# Patient Record
Sex: Female | Born: 1945 | Race: Black or African American | Hispanic: No | Marital: Married | Smoking: Never smoker
Health system: Southern US, Community
[De-identification: ages and names within clinical notes are randomized; demographics above are authoritative.]

## PROBLEM LIST (undated history)

## (undated) DIAGNOSIS — N6019 Diffuse cystic mastopathy of unspecified breast: Secondary | ICD-10-CM

## (undated) DIAGNOSIS — N63 Unspecified lump in unspecified breast: Secondary | ICD-10-CM

## (undated) DIAGNOSIS — I10 Essential (primary) hypertension: Secondary | ICD-10-CM

## (undated) DIAGNOSIS — Z1211 Encounter for screening for malignant neoplasm of colon: Secondary | ICD-10-CM

## (undated) DIAGNOSIS — M8580 Other specified disorders of bone density and structure, unspecified site: Secondary | ICD-10-CM

## (undated) HISTORY — DX: Diffuse cystic mastopathy of unspecified breast: N60.19

## (undated) HISTORY — DX: Essential (primary) hypertension: I10

## (undated) HISTORY — DX: Unspecified lump in unspecified breast: N63.0

## (undated) HISTORY — DX: Other specified disorders of bone density and structure, unspecified site: M85.80

## (undated) HISTORY — DX: Encounter for screening for malignant neoplasm of colon: Z12.11

## (undated) HISTORY — PX: TUBAL LIGATION: SHX77

---

## 1976-01-29 HISTORY — PX: LAPAROSCOPIC SALPINGOOPHERECTOMY: SUR795

## 1976-01-29 HISTORY — PX: ABDOMINAL HYSTERECTOMY: SHX81

## 1989-01-28 HISTORY — PX: BREAST BIOPSY: SHX20

## 2003-01-29 HISTORY — PX: COLONOSCOPY: SHX174

## 2004-09-27 ENCOUNTER — Ambulatory Visit: Payer: Self-pay | Admitting: General Surgery

## 2005-07-30 ENCOUNTER — Ambulatory Visit: Payer: Self-pay | Admitting: General Surgery

## 2005-10-04 ENCOUNTER — Ambulatory Visit: Payer: Self-pay | Admitting: General Surgery

## 2006-10-09 ENCOUNTER — Ambulatory Visit: Payer: Self-pay | Admitting: General Surgery

## 2007-10-13 ENCOUNTER — Ambulatory Visit: Payer: Self-pay | Admitting: General Surgery

## 2007-10-16 ENCOUNTER — Ambulatory Visit: Payer: Self-pay | Admitting: General Surgery

## 2007-10-31 ENCOUNTER — Emergency Department: Payer: Self-pay | Admitting: Internal Medicine

## 2007-12-09 ENCOUNTER — Ambulatory Visit: Payer: Self-pay | Admitting: Urology

## 2008-10-20 ENCOUNTER — Ambulatory Visit: Payer: Self-pay | Admitting: General Surgery

## 2009-10-23 ENCOUNTER — Ambulatory Visit: Payer: Self-pay | Admitting: General Surgery

## 2010-10-03 ENCOUNTER — Ambulatory Visit: Payer: Self-pay | Admitting: Family Medicine

## 2010-11-08 ENCOUNTER — Ambulatory Visit: Payer: Self-pay | Admitting: General Surgery

## 2011-11-14 ENCOUNTER — Ambulatory Visit: Payer: Self-pay | Admitting: General Surgery

## 2012-07-01 ENCOUNTER — Encounter: Payer: Self-pay | Admitting: *Deleted

## 2012-11-19 ENCOUNTER — Ambulatory Visit: Payer: Self-pay | Admitting: General Surgery

## 2012-11-20 ENCOUNTER — Encounter: Payer: Self-pay | Admitting: General Surgery

## 2012-11-25 ENCOUNTER — Ambulatory Visit: Payer: Self-pay | Admitting: General Surgery

## 2012-12-03 ENCOUNTER — Ambulatory Visit (INDEPENDENT_AMBULATORY_CARE_PROVIDER_SITE_OTHER): Payer: Medicare PPO | Admitting: General Surgery

## 2012-12-03 ENCOUNTER — Encounter: Payer: Self-pay | Admitting: General Surgery

## 2012-12-03 VITALS — BP 130/68 | HR 74 | Resp 16 | Ht 66.0 in | Wt 143.0 lb

## 2012-12-03 DIAGNOSIS — Z1239 Encounter for other screening for malignant neoplasm of breast: Secondary | ICD-10-CM

## 2012-12-03 DIAGNOSIS — N6019 Diffuse cystic mastopathy of unspecified breast: Secondary | ICD-10-CM | POA: Insufficient documentation

## 2012-12-03 NOTE — Progress Notes (Signed)
Patient ID: Melody Randall, female   DOB: [birth year], 67 y.o.   MRN: [id number]  Chief Complaint  Patient presents with  . Follow-up    mammogram    HPI Melody Randall is a 67 y.o. female who presents for a breast evaluation. The most recent mammogram was done on 11/19/12. Patient does perform regular self breast checks and gets regular mammograms done. The patient denies any problems with the breasts at this time.    HPI  Past Medical History  Diagnosis Date  . Hypertension   . Diffuse cystic mastopathy   . Osteopenia   . Lump or mass in breast     Past Surgical History  Procedure Laterality Date  . Abdominal hysterectomy  1978  . Laparoscopic salpingoopherectomy  1978  . Tubal ligation    . Colonoscopy  2005    Rihaan Barrack  . Breast biopsy Left 1991    History reviewed. No pertinent family history.  Social History History  Substance Use Topics  . Smoking status: Never Smoker   . Smokeless tobacco: Never Used  . Alcohol Use: No    No Known Allergies  Current Outpatient Prescriptions  Medication Sig Dispense Refill  . triamterene-hydrochlorothiazide (MAXZIDE-25) 37.5-25 MG per tablet Take 1 tablet by mouth daily.       No current facility-administered medications for this visit.    Review of Systems Review of Systems  Constitutional: Negative.   Respiratory: Negative.   Cardiovascular: Negative.     Blood pressure 130/68, pulse 74, resp. rate 16, height 5\' 6"  (1.676 m), weight 143 lb (64.864 kg).  Physical Exam Physical Exam  Constitutional: She is oriented to person, place, and time. She appears well-developed and well-nourished.  Eyes: Conjunctivae are normal. No scleral icterus.  Neck: No thyromegaly present.  Cardiovascular: Normal rate, regular rhythm and normal heart sounds.   No murmur heard. Pulmonary/Chest: Effort normal and breath sounds normal. Right breast exhibits no inverted nipple, no mass, no nipple discharge, no skin change and no tenderness.  Left breast exhibits no inverted nipple, no mass, no nipple discharge, no skin change and no tenderness.  Lymphadenopathy:    She has no cervical adenopathy.    She has no axillary adenopathy.  Neurological: She is alert and oriented to person, place, and time.  Skin: Skin is warm and dry.    Data Reviewed  Mammogram reviewed.   Assessment    Exam stable.      Plan    Patient to return in 1 year with bilateral screening mammogram.        Gerlene Burdock G 12/03/2012, 10:49 AM

## 2012-12-03 NOTE — Patient Instructions (Signed)
Patient to return in 1 year with bilateral screening mammogram. Patient to continue self breast checks. She is advised to contact our office with any new questions or concerns.

## 2013-11-29 ENCOUNTER — Encounter: Payer: Self-pay | Admitting: General Surgery

## 2013-12-16 ENCOUNTER — Ambulatory Visit: Payer: Medicare PPO | Admitting: General Surgery

## 2014-01-06 ENCOUNTER — Ambulatory Visit: Payer: Self-pay | Admitting: Nurse Practitioner

## 2014-01-13 ENCOUNTER — Ambulatory Visit: Payer: Self-pay | Admitting: General Surgery

## 2014-01-13 ENCOUNTER — Encounter: Payer: Self-pay | Admitting: General Surgery

## 2014-01-18 ENCOUNTER — Ambulatory Visit (INDEPENDENT_AMBULATORY_CARE_PROVIDER_SITE_OTHER): Payer: Medicare PPO | Admitting: General Surgery

## 2014-01-18 ENCOUNTER — Encounter: Payer: Self-pay | Admitting: General Surgery

## 2014-01-18 VITALS — BP 118/78 | HR 76 | Resp 16 | Ht 65.0 in | Wt 149.0 lb

## 2014-01-18 DIAGNOSIS — Z1239 Encounter for other screening for malignant neoplasm of breast: Secondary | ICD-10-CM

## 2014-01-18 DIAGNOSIS — N6019 Diffuse cystic mastopathy of unspecified breast: Secondary | ICD-10-CM

## 2014-01-18 NOTE — Patient Instructions (Signed)
Patient to return in 1 year with bilateral screening mammogram. Continue self breast exams. Call office for any new breast issues or concerns.  

## 2014-01-18 NOTE — Progress Notes (Signed)
Patient ID: Melody Randall, female   DOB: [birth year], 68 y.o.   MRN: [id number]  Chief Complaint  Patient presents with  . Follow-up    mammogram     HPI Melody ClockGladys L Schake is a 68 y.o. female who presents for a breast evaluation. The most recent mammogram was done on 01/13/14. Patient does perform regular self breast checks and gets regular mammograms done. The patient denies any new problems with the breasts at this time.   Few weeks ago her sister died suddenly from MI  HPI  Past Medical History  Diagnosis Date  . Hypertension   . Diffuse cystic mastopathy   . Osteopenia   . Lump or mass in breast     Past Surgical History  Procedure Laterality Date  . Abdominal hysterectomy  1978  . Laparoscopic salpingoopherectomy  1978  . Tubal ligation    . Colonoscopy  2005    [facility] Lauf  . Breast biopsy Left 1991    Family History  Problem Relation Age of Onset  . Heart attack Sister   . Cancer Sister     bone    Social History History  Substance Use Topics  . Smoking status: Never Smoker   . Smokeless tobacco: Never Used  . Alcohol Use: No    No Known Allergies  Current Outpatient Prescriptions  Medication Sig Dispense Refill  . SIMVASTATIN PO Take 1 tablet by mouth daily.    Marland Kitchen. triamterene-hydrochlorothiazide (MAXZIDE-25) 37.5-25 MG per tablet Take 1 tablet by mouth daily.     No current facility-administered medications for this visit.    Review of Systems Review of Systems  Constitutional: Negative.   Respiratory: Negative.   Cardiovascular: Negative.     Blood pressure 118/78, pulse 76, resp. rate 16, height 5\' 5"  (1.651 m), weight 149 lb (67.586 kg).  Physical Exam Physical Exam  Constitutional: She is oriented to person, place, and time. She appears well-developed and well-nourished.  Eyes: Conjunctivae are normal. No scleral icterus.  Neck: Neck supple. No thyromegaly present.  Cardiovascular: Normal rate, regular rhythm and normal heart sounds.   No  murmur heard. Pulmonary/Chest: Effort normal and breath sounds normal. Right breast exhibits no inverted nipple, no mass, no nipple discharge, no skin change and no tenderness. Left breast exhibits no inverted nipple, no mass, no nipple discharge, no skin change and no tenderness.  Lymphadenopathy:    She has no cervical adenopathy.    She has no axillary adenopathy.  Neurological: She is alert and oriented to person, place, and time.  Skin: Skin is warm and dry.    Data Reviewed Mammogram reviewed and stable.   Assessment   History of FCD and prior breast mass Stable exam. Patient will be due for a screening colonoscopy next year.     Plan    Patient to return in 1 year with bilateral screening mammogram.        Gerlene BurdockSANKAR,Clif Serio G 01/19/2014, 5:42 AM

## 2014-01-19 ENCOUNTER — Encounter: Payer: Self-pay | Admitting: General Surgery

## 2014-11-21 ENCOUNTER — Other Ambulatory Visit: Payer: Self-pay

## 2014-11-21 DIAGNOSIS — Z1231 Encounter for screening mammogram for malignant neoplasm of breast: Secondary | ICD-10-CM

## 2015-01-19 ENCOUNTER — Ambulatory Visit
Admission: RE | Admit: 2015-01-19 | Discharge: 2015-01-19 | Disposition: A | Discharge status: Home / Self Care | Payer: Medicare PPO | Source: Ambulatory Visit | Attending: General Surgery | Admitting: General Surgery

## 2015-01-19 ENCOUNTER — Other Ambulatory Visit: Payer: Self-pay | Admitting: General Surgery

## 2015-01-19 DIAGNOSIS — Z1231 Encounter for screening mammogram for malignant neoplasm of breast: Secondary | ICD-10-CM

## 2015-01-26 ENCOUNTER — Ambulatory Visit (INDEPENDENT_AMBULATORY_CARE_PROVIDER_SITE_OTHER): Payer: Medicare PPO | Admitting: General Surgery

## 2015-01-26 ENCOUNTER — Other Ambulatory Visit: Payer: Medicare PPO

## 2015-01-26 ENCOUNTER — Encounter: Payer: Self-pay | Admitting: General Surgery

## 2015-01-26 ENCOUNTER — Ambulatory Visit: Payer: Medicare PPO | Admitting: General Surgery

## 2015-01-26 VITALS — BP 120/78 | HR 66 | Resp 12 | Ht 65.0 in | Wt 148.0 lb

## 2015-01-26 DIAGNOSIS — N63 Unspecified lump in breast: Secondary | ICD-10-CM

## 2015-01-26 DIAGNOSIS — N6310 Unspecified lump in the right breast, unspecified quadrant: Secondary | ICD-10-CM

## 2015-01-26 DIAGNOSIS — Z87898 Personal history of other specified conditions: Secondary | ICD-10-CM

## 2015-01-26 NOTE — Patient Instructions (Signed)
The patient is aware to call back for any questions or concerns.  

## 2015-01-26 NOTE — Progress Notes (Signed)
Patient ID: Melody Randall, female   DOB: 27-Jan-1946, 69 y.o.   MRN: [id number]  Chief Complaint  Patient presents with  . Follow-up    mammogram    HPI Melody Randall is a 69 y.o. female.  who presents for a breast evaluation. The most recent mammogram was done on 01-19-15.  Patient does perform regular self breast checks and gets regular mammograms done.  No new breast issues. She has had a head cold and cough for about 3 weeks. Her last colonoscopy was 2005. I have reviewed the history of present illness with the patient.  HPI  Past Medical History  Diagnosis Date  . Hypertension   . Diffuse cystic mastopathy   . Osteopenia   . Lump or mass in breast     Past Surgical History  Procedure Laterality Date  . Abdominal hysterectomy  1978  . Laparoscopic salpingoopherectomy  1978  . Tubal ligation    . Colonoscopy  2005    Melody Randall  . Breast biopsy Left 1991    Family History  Problem Relation Age of Onset  . Heart attack Sister   . Cancer Sister     bone  . Breast cancer Maternal Aunt     Social History Social History  Substance Use Topics  . Smoking status: Never Smoker   . Smokeless tobacco: Never Used  . Alcohol Use: No    No Known Allergies  Current Outpatient Prescriptions  Medication Sig Dispense Refill  . aspirin 81 MG tablet Take 81 mg by mouth daily.    . Calcium Carbonate (CALTRATE 600 PO) Take by mouth.    Marland Kitchen SIMVASTATIN PO Take 1 tablet by mouth daily.    Marland Kitchen triamterene-hydrochlorothiazide (MAXZIDE-25) 37.5-25 MG per tablet Take 1 tablet by mouth daily.     No current facility-administered medications for this visit.    Review of Systems Review of Systems  Constitutional: Negative.   Respiratory: Negative.   Cardiovascular: Negative.     Blood pressure 120/78, pulse 66, resp. rate 12, height  (1.651 Randall), weight 148 lb (67.132 kg).  Physical Exam Physical Exam  Constitutional: She is oriented to person, place, and time. She appears  well-developed and well-nourished.  HENT:  Mouth/Throat: Oropharynx is clear and moist.  Eyes: Conjunctivae are normal. No scleral icterus.  Neck: Neck supple.  Cardiovascular: Normal rate, regular rhythm and normal heart sounds.   Pulmonary/Chest: Effort normal and breath sounds normal. Right breast exhibits mass. Right breast exhibits no inverted nipple, no nipple discharge, no skin change and no tenderness. Left breast exhibits no inverted nipple, no mass, no nipple discharge, no skin change and no tenderness.  1.5 cm ill defined mass/thickening 7-8 o'clock right breast at 5 CFN.  Abdominal: Soft. There is no tenderness.  Lymphadenopathy:    She has no cervical adenopathy.    She has no axillary adenopathy.  Neurological: She is alert and oriented to person, place, and time.  Skin: Skin is warm and dry.  Psychiatric: Her behavior is normal.    Data Reviewed Mammogram reviewed and stable. [place] targeted over thickening in right breast-normal. Assessment    History of FCD and prior breast mass. New finding in right breast She is due for her colonoscopy, discussed Cologuard as an alternative. Patient agrees to [employer]. No distinct mass seen on office ultrasound today.    Plan    Instructions reviewed for Cologuard. Patient to return in 3 months for reassessment of right breast thickening.Marland Kitchen  PCP:  Leanna SatoMiles, Melody Randall This information has been scribed by Dorathy DaftMarsha Randall RNBC.   Melody Randall G 01/26/2015, 11:29 AM

## 2015-02-09 ENCOUNTER — Encounter: Payer: Self-pay | Admitting: *Deleted

## 2015-04-06 ENCOUNTER — Ambulatory Visit (INDEPENDENT_AMBULATORY_CARE_PROVIDER_SITE_OTHER): Payer: Medicare PPO | Admitting: General Surgery

## 2015-04-06 ENCOUNTER — Encounter: Payer: Self-pay | Admitting: General Surgery

## 2015-04-06 VITALS — BP 122/70 | HR 70 | Resp 12 | Ht 65.0 in | Wt 147.0 lb

## 2015-04-06 DIAGNOSIS — Z87898 Personal history of other specified conditions: Secondary | ICD-10-CM | POA: Diagnosis not present

## 2015-04-06 DIAGNOSIS — N63 Unspecified lump in breast: Secondary | ICD-10-CM

## 2015-04-06 DIAGNOSIS — N6310 Unspecified lump in the right breast, unspecified quadrant: Secondary | ICD-10-CM

## 2015-04-06 NOTE — Patient Instructions (Addendum)
Continue self breast exams. Call office for any new breast issues or concerns. Follow up in December 2017 with bilateral screening mammogram.

## 2015-04-06 NOTE — Progress Notes (Signed)
Patient ID: Melody Randall, female   DOB: [birth year], 70 y.o.   MRN: [id number]  Chief Complaint  Patient presents with  . Follow-up    breast mass    HPI Melody Randall is a 70 y.o. female here today for her three month follow up right breast mass. No new breast issues. At her last yearly visit, in December 2016, she was noted to have a mass in the right breast with negative imaging. Also she was due to have a colaguard but apparently she did not receive the kit.  I have reviewed the history of present illness with the patient. HPI  Past Medical History  Diagnosis Date  . Hypertension   . Diffuse cystic mastopathy   . Osteopenia   . Lump or mass in breast     Past Surgical History  Procedure Laterality Date  . Abdominal hysterectomy  1978  . Laparoscopic salpingoopherectomy  1978  . Tubal ligation    . Colonoscopy  2005    Jaquavis Felmlee  . Breast biopsy Left 1991    Family History  Problem Relation Age of Onset  . Heart attack Sister   . Cancer Sister     bone  . Breast cancer Maternal Aunt     Social History Social History  Substance Use Topics  . Smoking status: Never Smoker   . Smokeless tobacco: Never Used  . Alcohol Use: No    No Known Allergies  Current Outpatient Prescriptions  Medication Sig Dispense Refill  . aspirin 81 MG tablet Take 81 mg by mouth daily.    . Calcium Carbonate (CALTRATE 600 PO) Take by mouth.    . lovastatin (MEVACOR) 20 MG tablet     . SIMVASTATIN PO Take 1 tablet by mouth daily.    Marland Kitchen triamterene-hydrochlorothiazide (MAXZIDE-25) 37.5-25 MG per tablet Take 1 tablet by mouth daily.     No current facility-administered medications for this visit.    Review of Systems Review of Systems  Constitutional: Negative.   Respiratory: Negative.   Cardiovascular: Negative.     Blood pressure 122/70, pulse 70, resp. rate 12, height 5' 5"  (1.651 m), weight 147 lb (66.679 kg).  Physical Exam Physical Exam  Constitutional: She is oriented  to person, place, and time. She appears well-developed and well-nourished.  Neck: Neck supple.  Pulmonary/Chest: Right breast exhibits no inverted nipple, no mass, no nipple discharge, no skin change and no tenderness. Left breast exhibits no inverted nipple, no mass, no nipple discharge, no skin change and no tenderness.  The prior thickening of the right breast at 7-8 oclock is not palpable today.   Lymphadenopathy:    She has no cervical adenopathy.  Neurological: She is alert and oriented to person, place, and time.  Skin: Skin is warm and dry.  Psychiatric: Her behavior is normal.    Data Reviewed Prior notes reviewed  Assessment    Prior right breast mass is resolved.     Plan     Follow up in December 2017 with bilateral screening mammogram. Will reorder cologard     PCP:  Marguerita Merles This information has been scribed by Karie Fetch RNBC.    Laniqua Torrens G 04/06/2015, 9:58 AM

## 2015-11-15 ENCOUNTER — Other Ambulatory Visit: Payer: Self-pay

## 2015-11-15 DIAGNOSIS — Z1231 Encounter for screening mammogram for malignant neoplasm of breast: Secondary | ICD-10-CM

## 2016-01-24 ENCOUNTER — Ambulatory Visit: Payer: Medicare PPO

## 2016-01-31 ENCOUNTER — Ambulatory Visit: Payer: Medicare PPO | Admitting: General Surgery

## 2016-02-08 ENCOUNTER — Ambulatory Visit
Admission: RE | Admit: 2016-02-08 | Discharge: 2016-02-08 | Disposition: A | Discharge status: Home / Self Care | Payer: Medicare PPO | Source: Ambulatory Visit | Attending: General Surgery | Admitting: General Surgery

## 2016-02-08 DIAGNOSIS — Z1231 Encounter for screening mammogram for malignant neoplasm of breast: Secondary | ICD-10-CM | POA: Insufficient documentation

## 2016-02-19 ENCOUNTER — Encounter: Payer: Self-pay | Admitting: *Deleted

## 2016-02-22 ENCOUNTER — Encounter: Payer: Self-pay | Admitting: General Surgery

## 2016-02-22 ENCOUNTER — Ambulatory Visit (INDEPENDENT_AMBULATORY_CARE_PROVIDER_SITE_OTHER): Payer: Medicare PPO | Admitting: General Surgery

## 2016-02-22 VITALS — BP 120/70 | HR 74 | Resp 12 | Ht 65.0 in | Wt 147.0 lb

## 2016-02-22 DIAGNOSIS — N6019 Diffuse cystic mastopathy of unspecified breast: Secondary | ICD-10-CM | POA: Diagnosis not present

## 2016-02-22 DIAGNOSIS — Z87898 Personal history of other specified conditions: Secondary | ICD-10-CM

## 2016-02-22 NOTE — Patient Instructions (Addendum)
The patient is aware to call back for any questions or concerns. Follow-up in one year with annual bilateral mammogram screening with Dr. Lemar LivingsByrnett. Patient to complete Cologuard at her convenience.

## 2016-02-22 NOTE — Progress Notes (Signed)
Patient ID: Melody Randall, female   DOB: December 27, 1945, 71 y.o.   MRN: [id number]  Chief Complaint  Patient presents with  . Follow-up    HPI Melody GarbeGladys P Randall is a 71 y.o. female who presents for a breast evaluation. The most recent mammogram was done on1/11/2016. No new breast breast complaints. Patient does perform regular self breast checks and gets regular mammograms done.   Last colonoscopy was done in 2005. I have reviewed the history of present illness with the patient.  HPI  Past Medical History:  Diagnosis Date  . Diffuse cystic mastopathy   . Hypertension   . Lump or mass in breast   . Osteopenia     Past Surgical History:  Procedure Laterality Date  . ABDOMINAL HYSTERECTOMY  1978  . BREAST BIOPSY Left 1991  . COLONOSCOPY  2005   Melody Randall  . LAPAROSCOPIC SALPINGOOPHERECTOMY  1978  . TUBAL LIGATION      Family History  Problem Relation Age of Onset  . Heart attack Sister   . Cancer Sister     bone  . Breast cancer Maternal Aunt     Social History Social History  Substance Use Topics  . Smoking status: Never Smoker  . Smokeless tobacco: Never Used  . Alcohol use No    No Known Allergies  Current Outpatient Prescriptions  Medication Sig Dispense Refill  . aspirin 81 MG tablet Take 81 mg by mouth daily.    . Calcium Carbonate (CALTRATE 600 PO) Take by mouth.    . lovastatin (MEVACOR) 20 MG tablet     . SIMVASTATIN PO Take 1 tablet by mouth daily.    Marland Kitchen. triamterene-hydrochlorothiazide (MAXZIDE-25) 37.5-25 MG per tablet Take 1 tablet by mouth daily.     No current facility-administered medications for this visit.     Review of Systems Review of Systems  Constitutional: Negative.   Respiratory: Negative.   Cardiovascular: Negative.     Blood pressure 120/70, pulse 74, resp. rate 12, height 5\' 5"  (1.651 m), weight 147 lb (66.7 kg).  Physical Exam Physical Exam  Constitutional: She is oriented to person, place, and time. She appears well-developed and  well-nourished.  HENT:  Mouth/Throat: Oropharynx is clear and moist.  Eyes: Conjunctivae are normal. No scleral icterus.  Neck: Neck supple.  Cardiovascular: Normal rate, regular rhythm and normal heart sounds.   Pulmonary/Chest: Effort normal and breath sounds normal. Right breast exhibits no inverted nipple, no mass, no nipple discharge, no skin change and no tenderness. Left breast exhibits no inverted nipple, no mass, no nipple discharge, no skin change and no tenderness.  Prior skin thickening at 7-8 o clock in right breast cannot be located/palpated.  Abdominal: Soft. Bowel sounds are normal. There is no tenderness.  Lymphadenopathy:    She has no cervical adenopathy.    She has no axillary adenopathy.  Neurological: She is alert and oriented to person, place, and time.  Skin: Skin is warm and dry.  Psychiatric: Her behavior is normal.    Data Reviewed Mammogram reviewed and stable.  Assessment    Stable exam.  History of fibrocystic disease. Patient had a screening colonoscopy that was normal in 2005. She was given the option to have another one or have a Cologuard done. She is still uncertain about this, and will decide.    Plan    Follow-up in one year with annual bilateral mammogram screening with Dr. Lemar LivingsByrnett. Patient instructed to call back with any new concerns/questions.  This information has been scribed by Ples Specter CMA.   Jerran Tappan G 02/22/2016, 10:03 AM

## 2016-04-09 ENCOUNTER — Encounter: Payer: Self-pay | Admitting: *Deleted

## 2016-04-09 NOTE — Progress Notes (Signed)
Cologuard was previously ordered. [facility] lab cancelled test due to inactive status, no sample received.Dr Evette CristalSankar aware.

## 2016-12-12 ENCOUNTER — Other Ambulatory Visit: Payer: Self-pay

## 2016-12-12 DIAGNOSIS — Z1231 Encounter for screening mammogram for malignant neoplasm of breast: Secondary | ICD-10-CM

## 2017-01-28 DIAGNOSIS — Z1211 Encounter for screening for malignant neoplasm of colon: Secondary | ICD-10-CM

## 2017-01-28 HISTORY — DX: Encounter for screening for malignant neoplasm of colon: Z12.11

## 2017-02-13 ENCOUNTER — Ambulatory Visit
Admission: RE | Admit: 2017-02-13 | Discharge: 2017-02-13 | Disposition: A | Discharge status: Home / Self Care | Payer: Medicare PPO | Source: Ambulatory Visit | Attending: General Surgery | Admitting: General Surgery

## 2017-02-13 DIAGNOSIS — Z1231 Encounter for screening mammogram for malignant neoplasm of breast: Secondary | ICD-10-CM | POA: Insufficient documentation

## 2017-02-20 ENCOUNTER — Encounter: Payer: Self-pay | Admitting: General Surgery

## 2017-02-20 ENCOUNTER — Ambulatory Visit (INDEPENDENT_AMBULATORY_CARE_PROVIDER_SITE_OTHER): Payer: Medicare PPO | Admitting: General Surgery

## 2017-02-20 VITALS — BP 110/64 | HR 73 | Resp 11 | Ht 65.0 in | Wt 143.0 lb

## 2017-02-20 DIAGNOSIS — Z1239 Encounter for other screening for malignant neoplasm of breast: Secondary | ICD-10-CM

## 2017-02-20 DIAGNOSIS — Z87898 Personal history of other specified conditions: Secondary | ICD-10-CM

## 2017-02-20 NOTE — Progress Notes (Signed)
Patient ID: Melody Randall, female   DOB: 1945/07/17, 72 y.o.   MRN: [id number]  Chief Complaint  Patient presents with  . Follow-up    HPI Melody Randall is a 72 y.o. female.  who presents for a breast evaluation. The most recent mammogram was done on 02-13-17.  Patient does perform regular self breast checks and gets regular mammograms done.    No new breast issues. She is a IT sales professional for [employer].  HPI  Past Medical History:  Diagnosis Date  . Diffuse cystic mastopathy   . Hypertension   . Lump or mass in breast   . Osteopenia     Past Surgical History:  Procedure Laterality Date  . ABDOMINAL HYSTERECTOMY  1978  . BREAST BIOPSY Left 1991  . COLONOSCOPY  2005   Sankar  . LAPAROSCOPIC SALPINGOOPHERECTOMY  1978  . TUBAL LIGATION      Family History  Problem Relation Age of Onset  . Heart attack Sister   . Cancer Sister        bone  . Breast cancer Maternal Aunt     Social History Social History   Tobacco Use  . Smoking status: Never Smoker  . Smokeless tobacco: Never Used  Substance Use Topics  . Alcohol use: No    Alcohol/week: 0.0 oz  . Drug use: No    No Known Allergies  Current Outpatient Medications  Medication Sig Dispense Refill  . aspirin 81 MG tablet Take 81 mg by mouth daily.    . Calcium Carbonate (CALTRATE 600 PO) Take by mouth.    . lovastatin (MEVACOR) 20 MG tablet     . SIMVASTATIN PO Take 1 tablet by mouth daily.    Marland Kitchen triamterene-hydrochlorothiazide (MAXZIDE-25) 37.5-25 MG per tablet Take 1 tablet by mouth daily.     No current facility-administered medications for this visit.     Review of Systems Review of Systems  Constitutional: Negative.   Respiratory: Negative.   Cardiovascular: Negative.     Blood pressure 110/64, pulse 73, resp. rate 11, height 5\' 5"  (1.651 m), weight 143 lb (64.9 kg), SpO2 99 %.  Physical Exam Physical Exam  Constitutional: She is oriented to person, place, and time. She appears well-developed  and well-nourished.  HENT:  Mouth/Throat: Oropharynx is clear and moist.  Eyes: Conjunctivae are normal. No scleral icterus.  Neck: Neck supple.  Cardiovascular: Normal rate, regular rhythm and normal heart sounds.  Pulmonary/Chest: Effort normal and breath sounds normal. Right breast exhibits no inverted nipple, no mass, no nipple discharge, no skin change and no tenderness. Left breast exhibits no inverted nipple, no mass, no nipple discharge, no skin change and no tenderness.    Lymphadenopathy:    She has no cervical adenopathy.    She has no axillary adenopathy.  Neurological: She is alert and oriented to person, place, and time.  Skin: Skin is warm and dry.  Psychiatric: Her behavior is normal.    Data Reviewed Bilateral screening mammograms dated February 14, 2016 reviewed.  BI-RADS-1.  Assessment    Benign breast exam.    Plan    Patient desires to continue annual screening exam through this office.  Patient had 11 siblings, no history of colon cancer or polyps.  One sister died of an acute MI, one with bone cancer.  Father died at age 76, mother alive at 20.      Discussed Cologuard vs Colonoscopy  Patient will be asked to return to the office in one  year with a bilateral screening mammogram.   HPI, Physical Exam, Assessment and Plan have been scribed under the direction and in the presence of Earline MayotteJeffrey W. Yago Ludvigsen, MD. Dorathy DaftMarsha Hatch, RN  I have completed the exam and reviewed the above documentation for accuracy and completeness.  I agree with the above.  [employer] has been used and any errors in dictation or transcription are unintentional.  Melody CurryJeffrey Anani Gu, M.D., F.A.C.S.  Melody PewJeffrey W Keasha Malkiewicz 02/20/2017, 9:47 AM

## 2017-02-20 NOTE — Patient Instructions (Addendum)
The patient is aware to call back for any questions or new concerns.  

## 2017-05-07 ENCOUNTER — Telehealth: Payer: Self-pay | Admitting: *Deleted

## 2017-05-07 NOTE — Telephone Encounter (Signed)
Ask the pt about the cologuard testing that she hs not completed. Please encourage pt to complete the Cologuard testing(stool sample) as recommended at her January appointment.

## 2017-05-14 NOTE — Telephone Encounter (Signed)
Unable to reach patient via phone. Mailed letter regarding Cologuard testing.

## 2017-12-29 ENCOUNTER — Other Ambulatory Visit: Payer: Self-pay

## 2017-12-29 DIAGNOSIS — Z1231 Encounter for screening mammogram for malignant neoplasm of breast: Secondary | ICD-10-CM

## 2018-02-19 ENCOUNTER — Ambulatory Visit
Admission: RE | Admit: 2018-02-19 | Discharge: 2018-02-19 | Disposition: A | Discharge status: Home / Self Care | Payer: Medicare HMO | Source: Ambulatory Visit | Attending: General Surgery | Admitting: General Surgery

## 2018-02-19 DIAGNOSIS — Z1231 Encounter for screening mammogram for malignant neoplasm of breast: Secondary | ICD-10-CM | POA: Insufficient documentation

## 2018-02-26 ENCOUNTER — Ambulatory Visit (INDEPENDENT_AMBULATORY_CARE_PROVIDER_SITE_OTHER): Payer: Medicare HMO | Admitting: General Surgery

## 2018-02-26 ENCOUNTER — Ambulatory Visit: Payer: Medicare PPO | Admitting: General Surgery

## 2018-02-26 ENCOUNTER — Encounter: Payer: Self-pay | Admitting: General Surgery

## 2018-02-26 ENCOUNTER — Other Ambulatory Visit: Payer: Self-pay

## 2018-02-26 VITALS — BP 122/72 | HR 75 | Resp 12 | Ht 65.0 in | Wt 146.6 lb

## 2018-02-26 DIAGNOSIS — Z1231 Encounter for screening mammogram for malignant neoplasm of breast: Secondary | ICD-10-CM

## 2018-02-26 NOTE — Progress Notes (Signed)
Patient ID: Melody Randall, female   DOB: March 12, 1945, 73 y.o.   MRN: [id number]  Chief Complaint  Patient presents with  . Follow-up    year f/u recall bil screening mammo [facility] 02/19/17,     HPI Melody Randall is a 73 y.o. female. here for her year follow up, bilateral screening mammogram completed on 02/19/17. No new breast issues.  She retired April 2019 and is doing well.  HPI  Past Medical History:  Diagnosis Date  . Colon cancer screening 2019   Cologuard negative  . Diffuse cystic mastopathy   . Hypertension   . Lump or mass in breast   . Osteopenia     Past Surgical History:  Procedure Laterality Date  . ABDOMINAL HYSTERECTOMY  1978  . BREAST BIOPSY Left 1991   neg  . COLONOSCOPY  2005   Sankar  . LAPAROSCOPIC SALPINGOOPHERECTOMY  1978  . TUBAL LIGATION      Family History  Problem Relation Age of Onset  . Heart attack Sister   . Cancer Sister        bone  . Breast cancer Maternal Aunt     Social History Social History   Tobacco Use  . Smoking status: Never Smoker  . Smokeless tobacco: Never Used  Substance Use Topics  . Alcohol use: No    Alcohol/week: 0.0 standard drinks  . Drug use: No    No Known Allergies  Current Outpatient Medications  Medication Sig Dispense Refill  . aspirin 81 MG tablet Take 81 mg by mouth as directed.     . Calcium Carbonate (CALTRATE 600 PO) Take by mouth.    . lovastatin (MEVACOR) 20 MG tablet Take 20 mg by mouth at bedtime.     . triamterene-hydrochlorothiazide (MAXZIDE-25) 37.5-25 MG per tablet Take 1 tablet by mouth daily.     No current facility-administered medications for this visit.     Review of Systems Review of Systems  Constitutional: Negative.   Respiratory: Negative.   Cardiovascular: Negative.     Blood pressure 122/72, pulse 75, resp. rate 12, height 5\' 5"  (1.651 m), weight 146 lb 9.6 oz (66.5 kg), SpO2 99 %.  Physical Exam Physical Exam Vitals signs reviewed. Exam conducted with a  chaperone present.  Neck:     Musculoskeletal: Normal range of motion and neck supple.  Cardiovascular:     Rate and Rhythm: Normal rate and regular rhythm.     Heart sounds: Normal heart sounds.  Pulmonary:     Effort: Pulmonary effort is normal.     Breath sounds: Normal breath sounds.  Chest:    Neurological:     Mental Status: She is alert.     Data Reviewed Bilateral screening mammograms dated February 19, 2018 reviewed.  No interval change.  BI-RADS-1.  Assessment    Benign breast exam.    Plan    The patient desires to continue annual screening exams through this office.  The patient does report having completed Cologuard testing last year with her PCP at the [place] [facility].       HPI, Physical Exam, Assessment and Plan have been scribed under the direction and in the presence of Earline Mayotte, MD. Dorathy Daft, RN  I have completed the exam and reviewed the above documentation for accuracy and completeness.  I agree with the above.  Museum/gallery conservator has been used and any errors in dictation or transcription are unintentional.  Donnalee Curry, M.D., F.A.C.S.  Earline Mayotte  02/26/2018, 11:11 AM

## 2018-02-26 NOTE — Patient Instructions (Addendum)
The patient is aware to call back for any questions or new concerns. Patient will be asked to return to the office in one year with a bilateral screening mammogram.  

## 2018-08-24 ENCOUNTER — Encounter: Payer: Self-pay | Admitting: General Surgery

## 2018-11-04 ENCOUNTER — Other Ambulatory Visit: Payer: Self-pay | Admitting: Family Medicine

## 2018-11-04 DIAGNOSIS — M8580 Other specified disorders of bone density and structure, unspecified site: Secondary | ICD-10-CM

## 2018-11-04 DIAGNOSIS — Z13820 Encounter for screening for osteoporosis: Secondary | ICD-10-CM

## 2019-01-04 ENCOUNTER — Other Ambulatory Visit: Payer: Self-pay

## 2019-01-04 DIAGNOSIS — Z1231 Encounter for screening mammogram for malignant neoplasm of breast: Secondary | ICD-10-CM

## 2019-03-03 ENCOUNTER — Ambulatory Visit: Payer: Medicare HMO | Admitting: Surgery

## 2019-03-25 ENCOUNTER — Ambulatory Visit
Admission: RE | Admit: 2019-03-25 | Discharge: 2019-03-25 | Disposition: A | Discharge status: Home / Self Care | Payer: Medicare HMO | Source: Ambulatory Visit | Attending: Surgery | Admitting: Surgery

## 2019-03-25 DIAGNOSIS — Z1231 Encounter for screening mammogram for malignant neoplasm of breast: Secondary | ICD-10-CM | POA: Diagnosis present

## 2019-04-02 ENCOUNTER — Ambulatory Visit (INDEPENDENT_AMBULATORY_CARE_PROVIDER_SITE_OTHER): Payer: Medicare HMO | Admitting: Surgery

## 2019-04-02 ENCOUNTER — Encounter: Payer: Self-pay | Admitting: Surgery

## 2019-04-02 ENCOUNTER — Other Ambulatory Visit: Payer: Self-pay

## 2019-04-02 VITALS — BP 157/84 | HR 74 | Temp 97.2°F | Resp 14 | Ht 66.0 in | Wt 147.6 lb

## 2019-04-02 DIAGNOSIS — Z1231 Encounter for screening mammogram for malignant neoplasm of breast: Secondary | ICD-10-CM

## 2019-04-02 NOTE — Patient Instructions (Addendum)
You may have your mammograms ordered through your primary care provider, you a due around February 26th 2022 for this.  Follow-up with our office as needed.  Please call and ask to speak with a nurse if you develop questions or concerns.   Continue self breast exams. Call office for any new breast issues or concerns.

## 2019-04-02 NOTE — Progress Notes (Signed)
04/02/2019  History of Present Illness: Melody Randall is a 74 y.o. female presenting for yearly follow up.  She has a history of left breast biopsy in 1991 which was negative for malignancy or atypia.  Her last mammogram was on 03/25/19 which was negative as well.  Patient denies any breast pain, palpable masses, skin changes, or nipple drainage.  Past Medical History: Past Medical History:  Diagnosis Date  . Colon cancer screening 2019   Cologuard negative  . Diffuse cystic mastopathy   . Hypertension   . Lump or mass in breast   . Osteopenia      Past Surgical History: Past Surgical History:  Procedure Laterality Date  . ABDOMINAL HYSTERECTOMY  1978  . BREAST BIOPSY Left 1991   neg  . COLONOSCOPY  2005   Sankar  . LAPAROSCOPIC SALPINGOOPHERECTOMY  1978  . TUBAL LIGATION      Home Medications: Prior to Admission medications   Medication Sig Start Date End Date Taking? Authorizing Provider  aspirin 81 MG tablet Take 81 mg by mouth as directed.    Yes [provider]  Calcium Carbonate (CALTRATE 600 PO) Take by mouth.   Yes [provider]  lovastatin (MEVACOR) 20 MG tablet Take 20 mg by mouth at bedtime.  02/08/15  Yes [provider]  triamterene-hydrochlorothiazide (MAXZIDE-25) 37.5-25 MG per tablet Take 1 tablet by mouth daily. 10/28/12  Yes [provider]    Allergies: No Known Allergies  Review of Systems: Review of Systems  Constitutional: Negative for chills and fever.  Respiratory: Negative for shortness of breath.   Cardiovascular: Negative for chest pain.  Gastrointestinal: Negative for nausea and vomiting.  Skin: Negative for rash.    Physical Exam BP (!) 157/84   Pulse 74   Temp (!) 97.2 F (36.2 C)   Resp 14   Ht 5\' 6"  (1.676 m)   Wt 147 lb 9.6 oz (67 kg)   SpO2 98%   BMI 23.82 kg/m  CONSTITUTIONAL: No acute distress HEENT:  Normocephalic, atraumatic, extraocular motion intact. RESPIRATORY:  Lungs are  clear, and breath sounds are equal bilaterally. Normal respiratory effort without pathologic use of accessory muscles. CARDIOVASCULAR: Heart is regular without murmurs, gallops, or rubs. BREAST:  Left breast without any palpable masses, skin changes, or nipple changes.  No left axillary or supraclavicular lymphadenopathy.  Right breast without palpable masses, skin changes, or nipple changes.  No right axillary or supraclavicular lymphadenopathy. NEUROLOGIC:  Motor and sensation is grossly normal.  Cranial nerves are grossly intact. PSYCH:  Alert and oriented to person, place and time. Affect is normal.  Labs/Imaging: Mammogram 03/25/19: FINDINGS: There are no findings suspicious for malignancy. Images were processed with CAD.  IMPRESSION: No mammographic evidence of malignancy. A result letter of this screening mammogram will be mailed directly to the patient.  RECOMMENDATION: Screening mammogram in one year  Assessment and Plan: This is a 74 y.o. female presenting for breast evaluation.  --Discussed with the patient that her mammogram and exam today are reassuring and there are no suspicious findings.   --At this point, will defer any further mammograms and exams to that patient's PCP.  She understands that if there are any changes or concerns, that we'd be happy to be of any assistance. --Follow up prn.  Face-to-face time spent with the patient and care providers was 15 minutes, with more than 50% of the time spent counseling, educating, and coordinating care of the patient.     60  Wynne Dust, MD  [facility]

## 2020-03-31 ENCOUNTER — Other Ambulatory Visit: Payer: Self-pay | Admitting: Family Medicine

## 2020-04-10 ENCOUNTER — Other Ambulatory Visit: Payer: Self-pay | Admitting: Family Medicine

## 2020-04-10 DIAGNOSIS — M8580 Other specified disorders of bone density and structure, unspecified site: Secondary | ICD-10-CM

## 2020-04-17 ENCOUNTER — Other Ambulatory Visit: Payer: Self-pay | Admitting: Family Medicine

## 2020-04-17 DIAGNOSIS — Z1231 Encounter for screening mammogram for malignant neoplasm of breast: Secondary | ICD-10-CM

## 2020-05-04 ENCOUNTER — Other Ambulatory Visit: Payer: Medicare PPO

## 2020-05-17 ENCOUNTER — Ambulatory Visit
Admission: RE | Admit: 2020-05-17 | Discharge: 2020-05-17 | Disposition: A | Discharge status: Home / Self Care | Payer: Medicare HMO | Source: Ambulatory Visit | Attending: Family Medicine | Admitting: Family Medicine

## 2020-05-17 ENCOUNTER — Other Ambulatory Visit: Payer: Self-pay

## 2020-05-17 DIAGNOSIS — M8580 Other specified disorders of bone density and structure, unspecified site: Secondary | ICD-10-CM

## 2020-05-17 DIAGNOSIS — M8589 Other specified disorders of bone density and structure, multiple sites: Secondary | ICD-10-CM | POA: Insufficient documentation

## 2020-05-17 DIAGNOSIS — Z780 Asymptomatic menopausal state: Secondary | ICD-10-CM | POA: Insufficient documentation

## 2020-05-17 DIAGNOSIS — Z1231 Encounter for screening mammogram for malignant neoplasm of breast: Secondary | ICD-10-CM | POA: Insufficient documentation

## 2020-05-17 DIAGNOSIS — Z13820 Encounter for screening for osteoporosis: Secondary | ICD-10-CM | POA: Insufficient documentation

## 2021-05-25 ENCOUNTER — Other Ambulatory Visit: Payer: Self-pay | Admitting: Family Medicine

## 2021-05-25 DIAGNOSIS — Z1231 Encounter for screening mammogram for malignant neoplasm of breast: Secondary | ICD-10-CM

## 2022-02-03 IMAGING — MG MM DIGITAL SCREENING BILAT W/ TOMO AND CAD
6 of 10 series · 6 of 30 positions shown · non-contrast
Comparison: Previous exam(s).

CLINICAL DATA: Screening.

EXAM:
DIGITAL SCREENING BILATERAL MAMMOGRAM WITH TOMOSYNTHESIS AND CAD
TECHNIQUE: Bilateral screening digital craniocaudal and mediolateral oblique
mammograms were obtained. Bilateral screening digital breast
tomosynthesis was performed. The images were evaluated with
computer-aided detection.

[L CC synth-2D (1 of 2)]
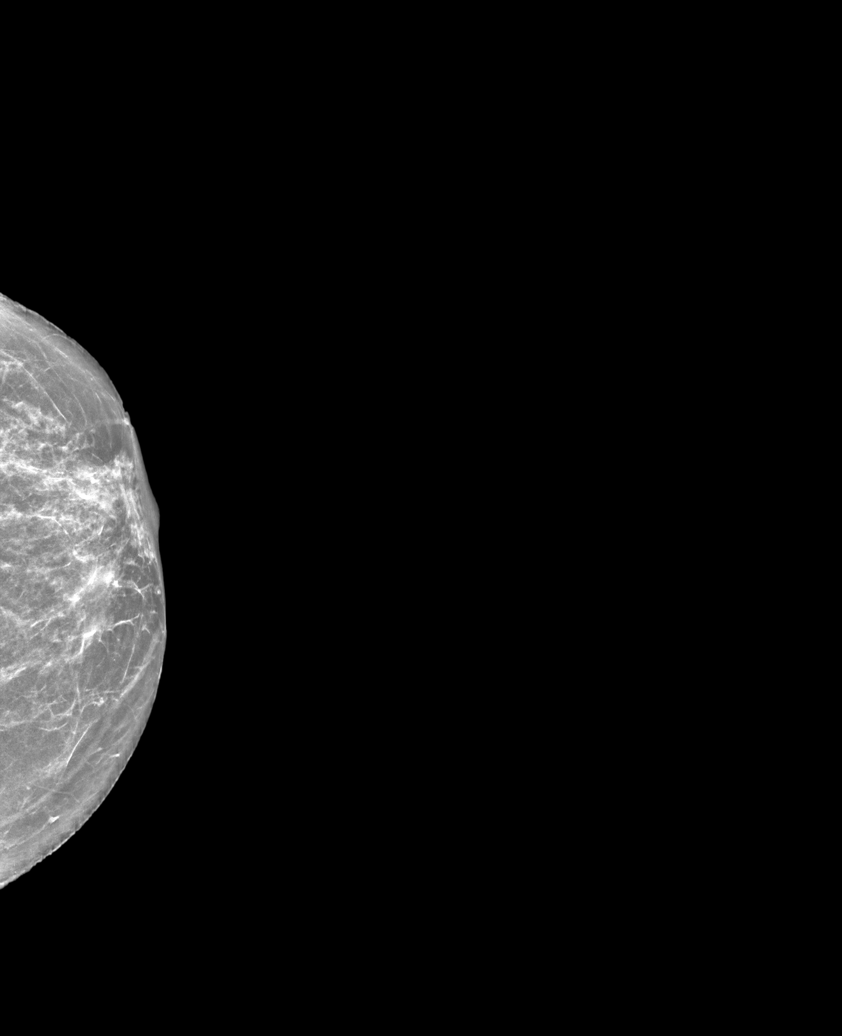

[R CC synth-2D]
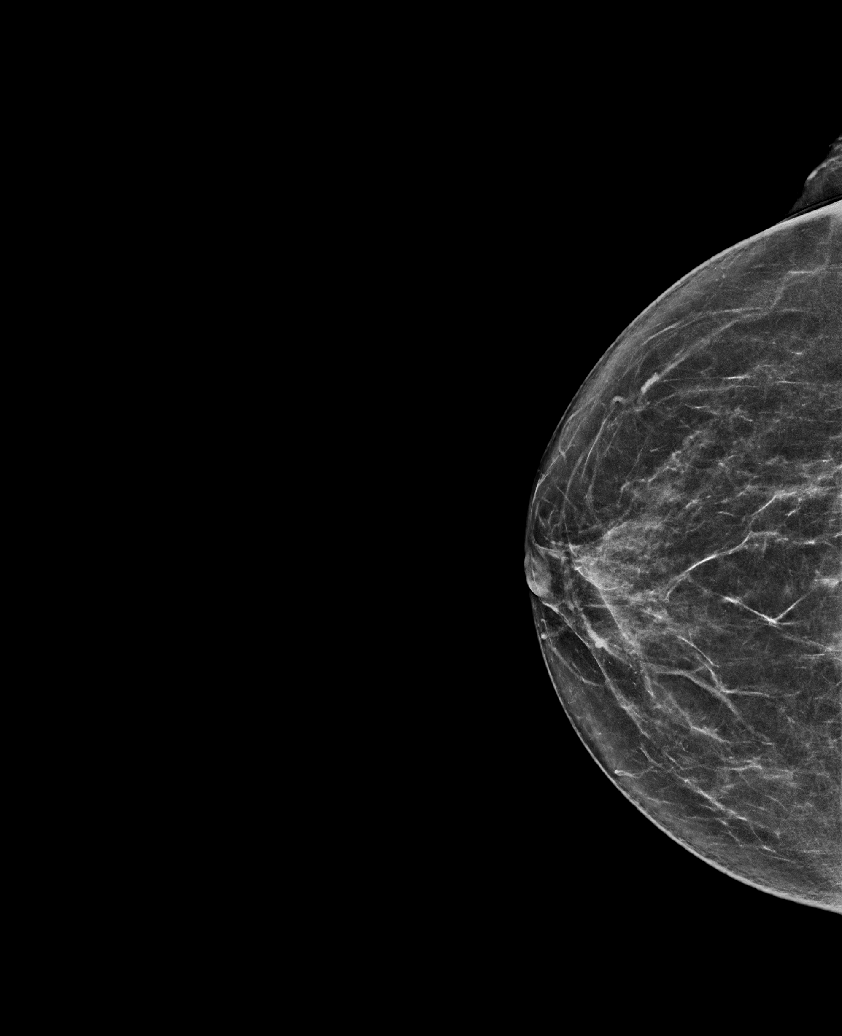

[L MLO synth-2D]
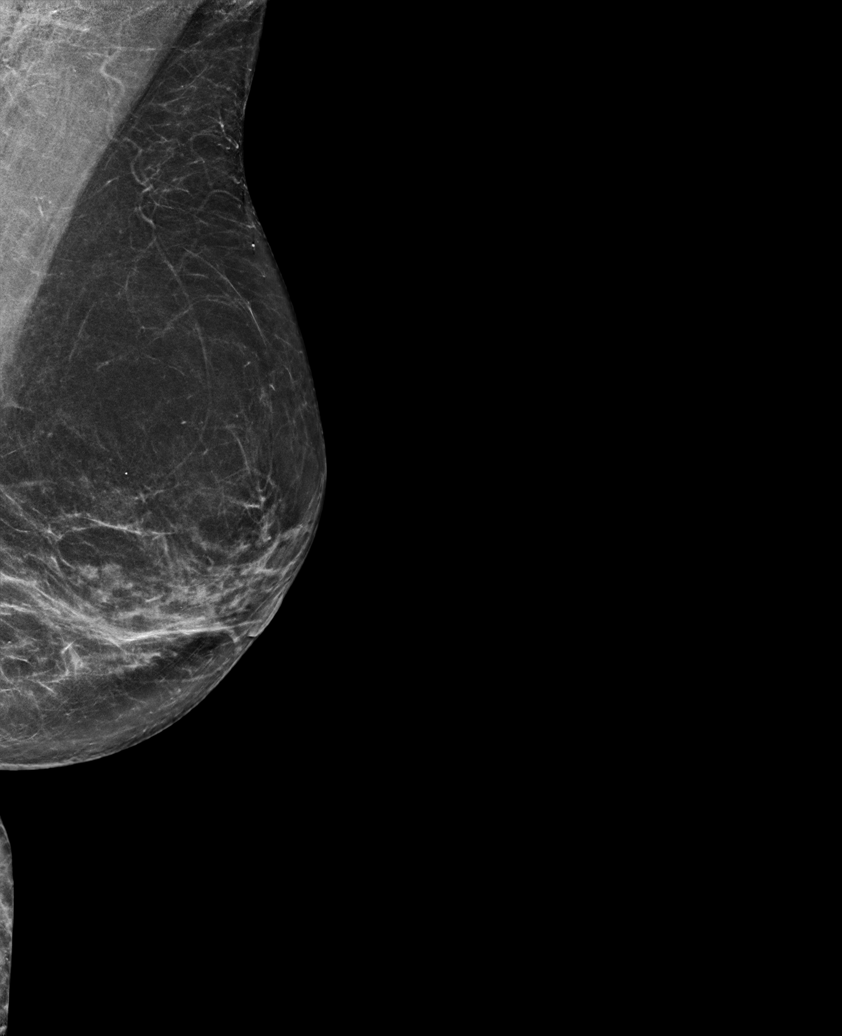

[L CC synth-2D (2 of 2)]
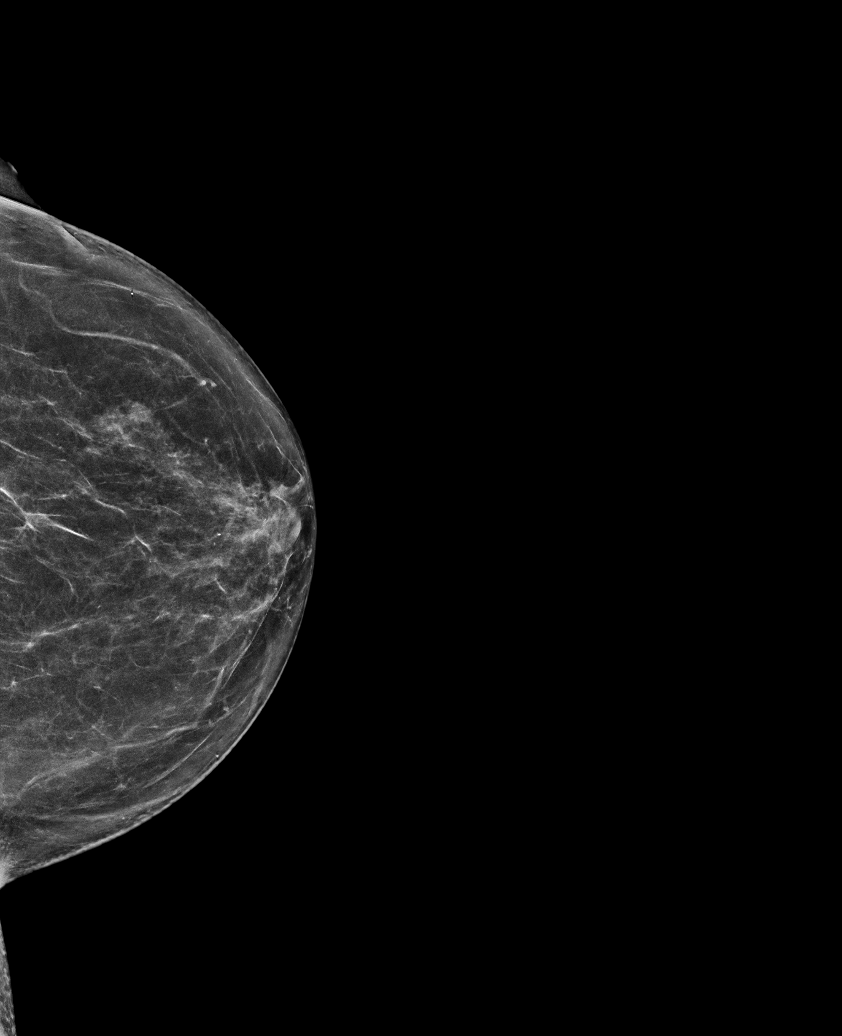

[R MLO synth-2D]
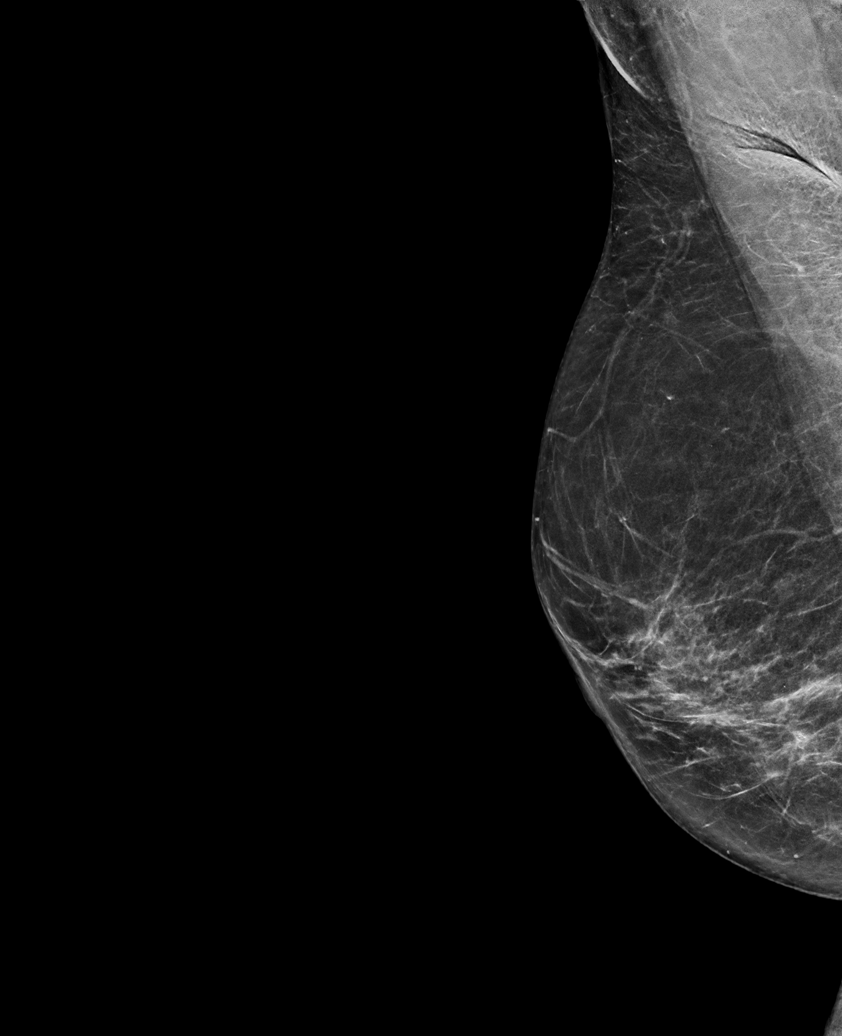

[L CC tomo · tomo slice 37/72.0]
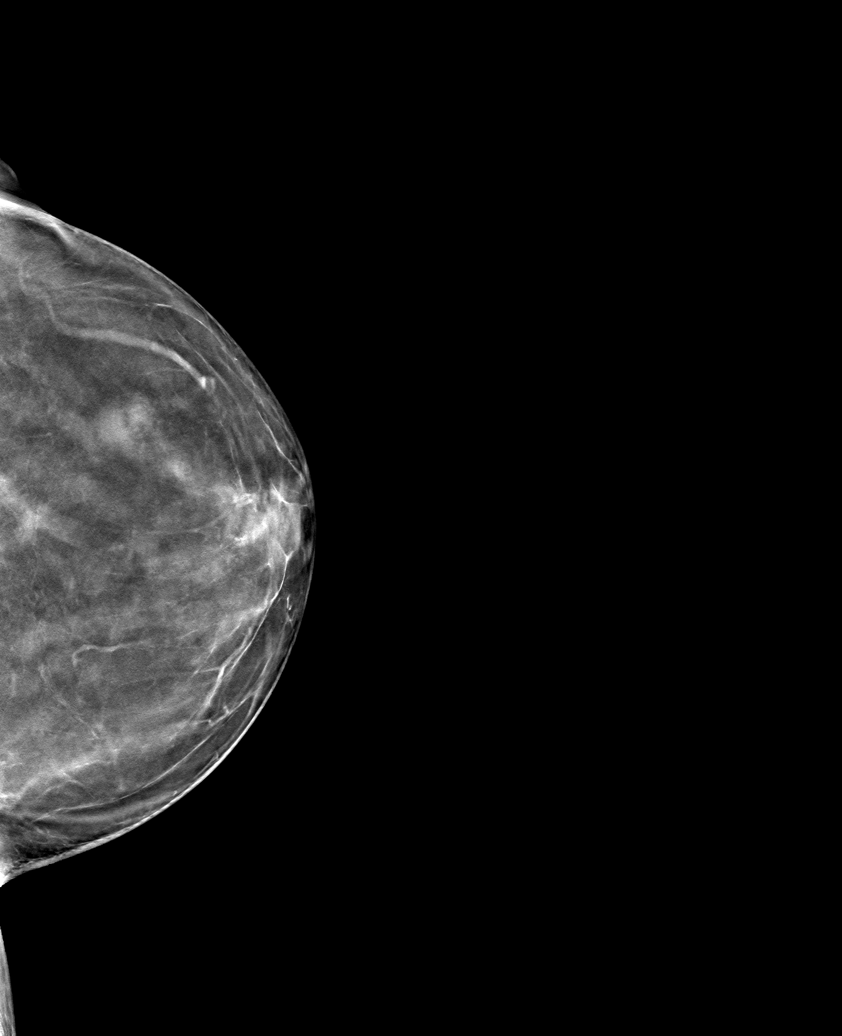

[6 of 30 positions shown; findings below may reference images not displayed]

ACR Breast Density Category b: There are scattered areas of
fibroglandular density.
FINDINGS: There are no findings suspicious for malignancy. The images were
evaluated with computer-aided detection.
IMPRESSION: No mammographic evidence of malignancy. A result letter of this
screening mammogram will be mailed directly to the patient.

RECOMMENDATION:
Screening mammogram in one year. (Code:[id number])

BI-RADS CATEGORY  1: Negative.
